# Patient Record
Sex: Male | Born: 2005 | Race: Black or African American | Hispanic: No | Marital: Single
Health system: Southern US, Community
[De-identification: ages and names within clinical notes are randomized; demographics above are authoritative.]

## PROBLEM LIST (undated history)

## (undated) DIAGNOSIS — J45909 Unspecified asthma, uncomplicated: Secondary | ICD-10-CM

## (undated) HISTORY — PX: OTHER SURGICAL HISTORY: SHX169

---
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
## 2024-03-09 IMAGING — CR DG HAND COMPLETE 3+V*R*
3 series · 3 of 3 positions shown · non-contrast
Comparison: None Available.

CLINICAL DATA: Punched a wall.  Injured right hand.

EXAM:
RIGHT HAND - COMPLETE 3+ VIEW

[hand pa]
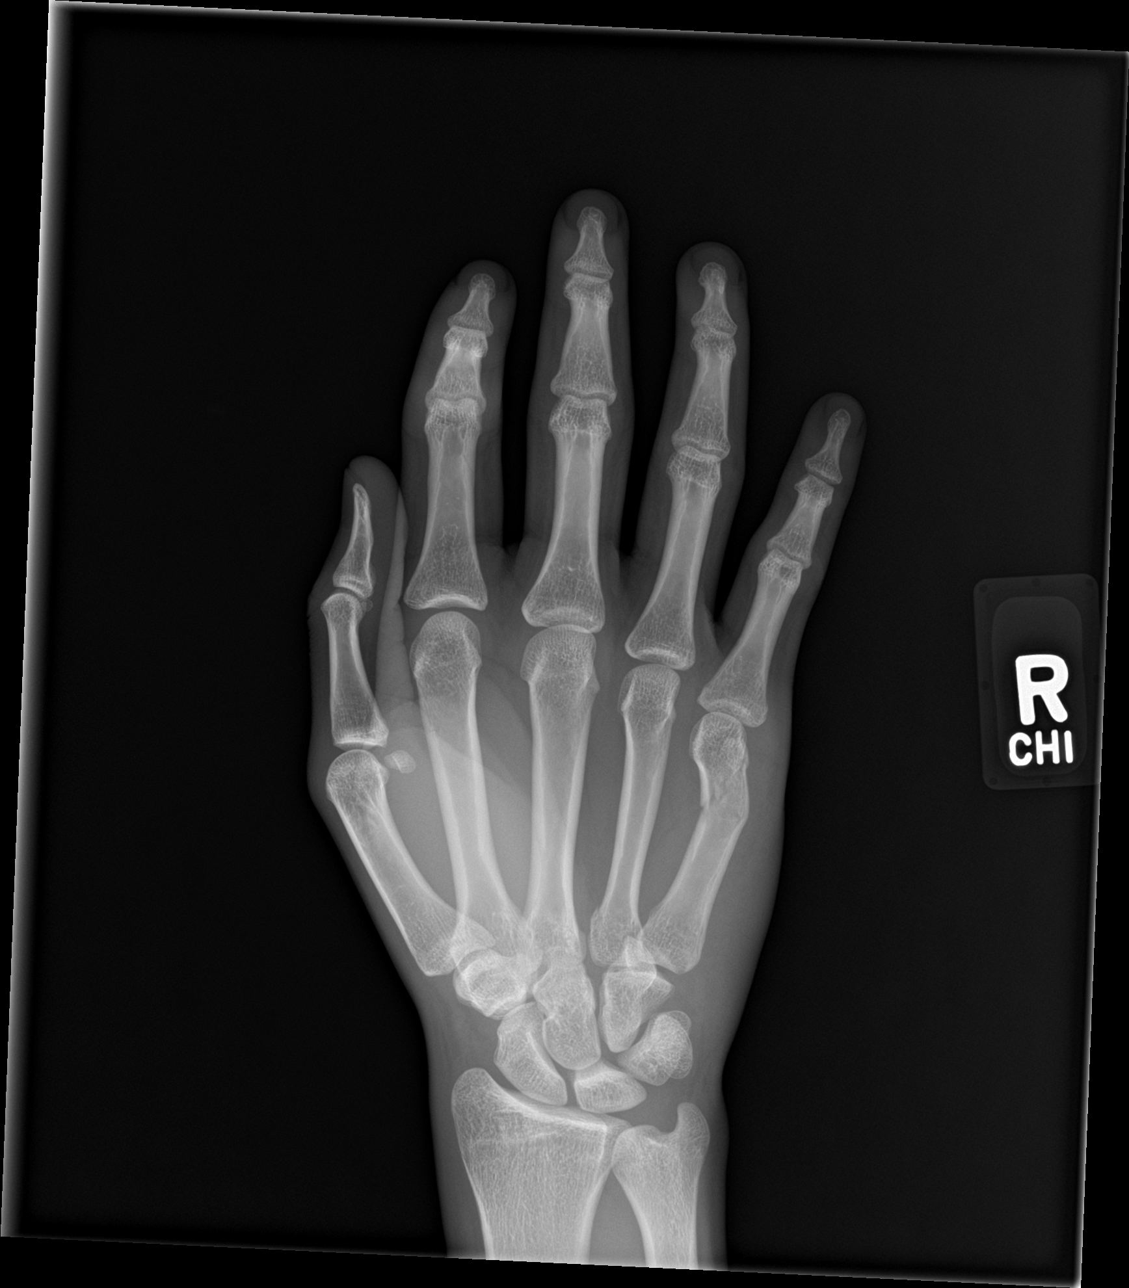

[hand obl]
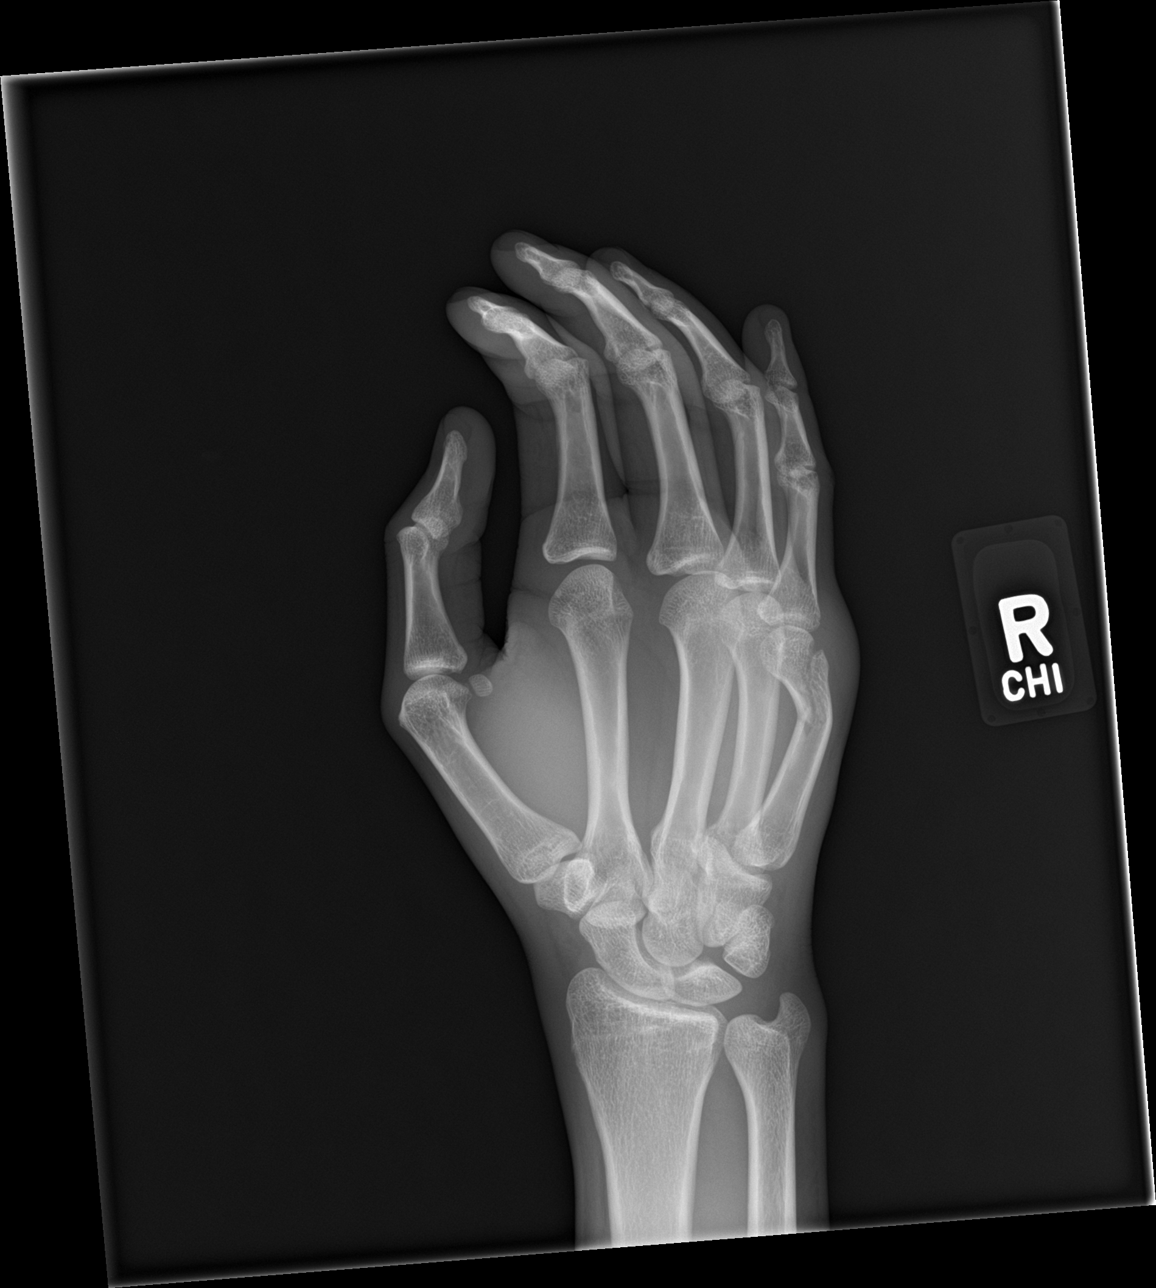

[hand lat]
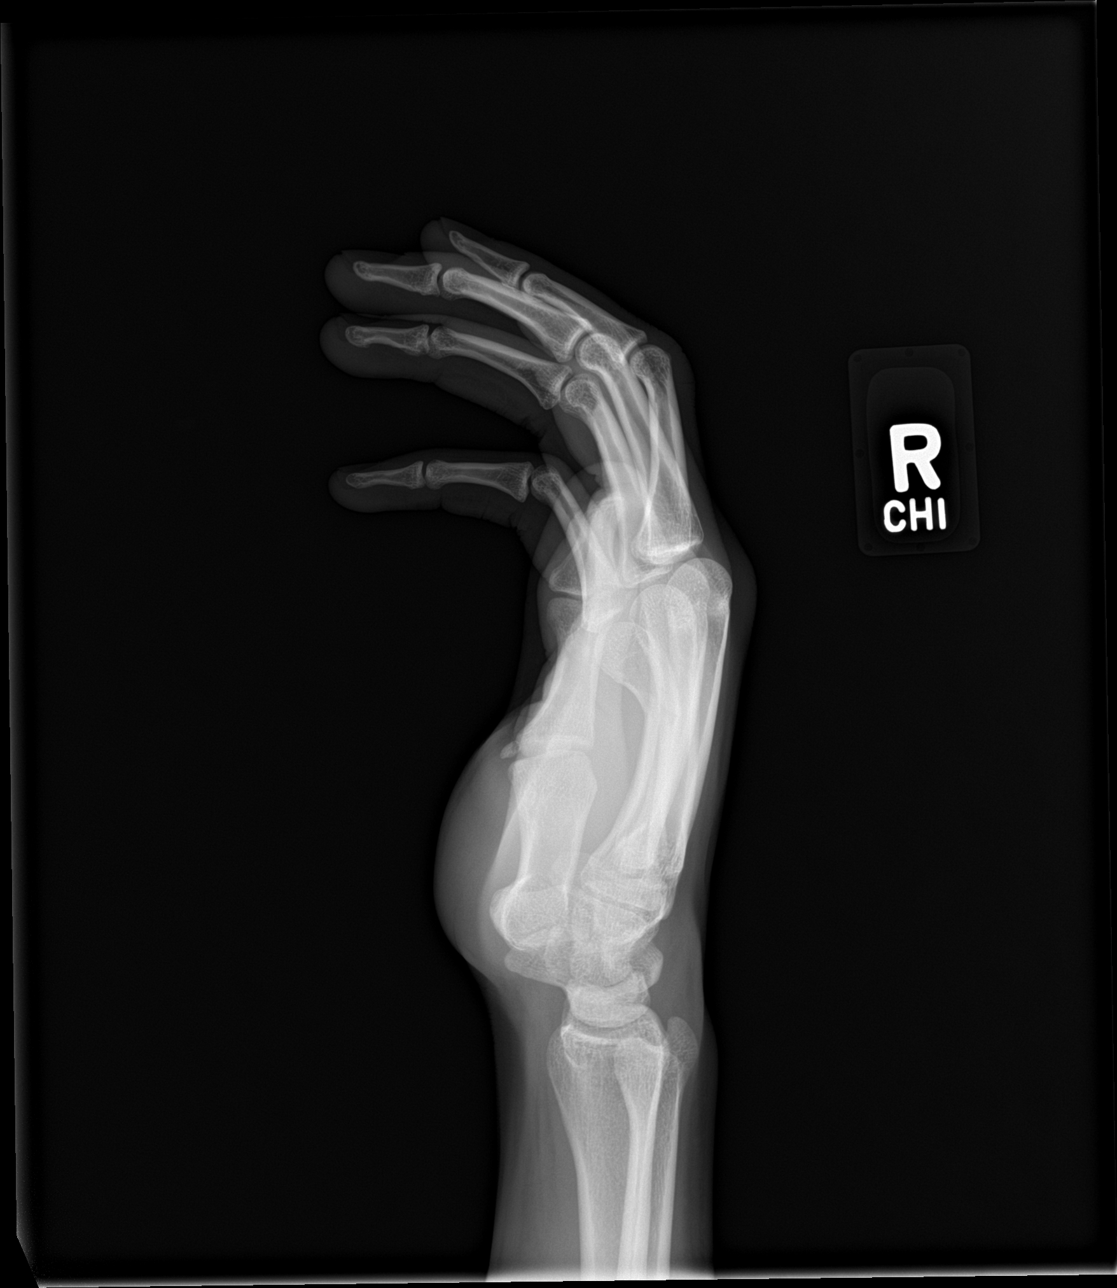

[3 of 3 positions shown; findings below may reference images not displayed]

FINDINGS: There is a dorsally angulated and mildly comminuted fracture
involving the fifth metacarpal neck and distal shaft.

There is also a nondisplaced fracture at the base of the fourth
metacarpal and widening of the space between the third and fourth
bases and possibly also between the capitate and hamate. Could not
exclude a ligamentous injury. CT may be helpful for further
evaluation. No wrist fracture.
IMPRESSION: 1. Fifth metacarpal neck and distal shaft fracture.
2. Nondisplaced fracture at the base of the fourth metacarpal.
3. Possible ligamentous injury involving the wrist/proximal hand. CT
may be helpful for further evaluation.
